# Patient Record
Sex: Female | Born: 1990 | Race: White | Hispanic: No | Marital: Single | Smoking: Never smoker
Health system: Southern US, Community
[De-identification: ages and names within clinical notes are randomized; demographics above are authoritative.]

## PROBLEM LIST (undated history)

## (undated) DIAGNOSIS — Z8742 Personal history of other diseases of the female genital tract: Secondary | ICD-10-CM

## (undated) HISTORY — PX: SHOULDER ARTHROSCOPY W/ LABRAL REPAIR: SHX2399

## (undated) HISTORY — PX: WISDOM TOOTH EXTRACTION: SHX21

## (undated) HISTORY — DX: Personal history of other diseases of the female genital tract: Z87.42

---

## 2016-12-14 ENCOUNTER — Ambulatory Visit (INDEPENDENT_AMBULATORY_CARE_PROVIDER_SITE_OTHER): Payer: Managed Care, Other (non HMO) | Admitting: Physician Assistant

## 2016-12-14 ENCOUNTER — Encounter: Payer: Self-pay | Admitting: Physician Assistant

## 2016-12-14 VITALS — BP 130/79 | HR 80 | Ht 67.0 in | Wt 196.0 lb

## 2016-12-14 DIAGNOSIS — Z13220 Encounter for screening for lipoid disorders: Secondary | ICD-10-CM

## 2016-12-14 DIAGNOSIS — Z0000 Encounter for general adult medical examination without abnormal findings: Secondary | ICD-10-CM | POA: Diagnosis not present

## 2016-12-14 DIAGNOSIS — Z8742 Personal history of other diseases of the female genital tract: Secondary | ICD-10-CM | POA: Insufficient documentation

## 2016-12-14 DIAGNOSIS — Z131 Encounter for screening for diabetes mellitus: Secondary | ICD-10-CM | POA: Diagnosis not present

## 2016-12-14 DIAGNOSIS — Z87898 Personal history of other specified conditions: Secondary | ICD-10-CM

## 2016-12-14 HISTORY — DX: Personal history of other diseases of the female genital tract: Z87.42

## 2016-12-14 LAB — COMPLETE METABOLIC PANEL WITH GFR
ALBUMIN: 4.2 g/dL (ref 3.6–5.1)
ALK PHOS: 55 U/L (ref 33–115)
ALT: 12 U/L (ref 6–29)
AST: 14 U/L (ref 10–30)
BUN: 14 mg/dL (ref 7–25)
CALCIUM: 9.1 mg/dL (ref 8.6–10.2)
CO2: 26 mmol/L (ref 20–31)
CREATININE: 0.79 mg/dL (ref 0.50–1.10)
Chloride: 105 mmol/L (ref 98–110)
GFR, Est African American: >89 mL/min (ref >=60)
GFR, Est Non African American: >89 mL/min (ref >=60)
GLUCOSE: 86 mg/dL (ref 65–99)
Potassium: 4.3 mmol/L (ref 3.5–5.3)
Sodium: 139 mmol/L (ref 135–146)
Total Bilirubin: 0.5 mg/dL (ref 0.2–1.2)
Total Protein: 6.8 g/dL (ref 6.1–8.1)

## 2016-12-14 LAB — LIPID PANEL
CHOL/HDL RATIO: 3.5 ratio (ref <5.0)
CHOLESTEROL: 179 mg/dL (ref <200)
HDL: 51 mg/dL (ref >50)
LDL Cholesterol: 106 mg/dL — ABNORMAL HIGH (ref <100)
Triglycerides: 109 mg/dL (ref <150)
VLDL: 22 mg/dL (ref <30)

## 2016-12-14 NOTE — Patient Instructions (Signed)

## 2016-12-14 NOTE — Progress Notes (Signed)
   Subjective:    Patient ID: Courtney Morgan, female    DOB: March 09, 1991, 25 y.o.   MRN: [id number]  HPI Pt is a 25 yo female who presents to the clinic to establish care.   She has no PMH.   .. Social History   Social History  . Marital status: Single    Spouse name: N/A  . Number of children: N/A  . Years of education: N/A   Occupational History  . Not on file.   Social History Main Topics  . Smoking status: Never Smoker  . Smokeless tobacco: Never Used  . Alcohol use Yes  . Drug use: No  . Sexual activity: Yes   Other Topics Concern  . Not on file   Social History Narrative  . No narrative on file   .Marland Kitchen. Family History  Problem Relation Age of Onset  . Arthritis/Rheumatoid Mother   . Diabetes Father   . Arthritis/Rheumatoid Maternal Uncle   . Diabetes Paternal Aunt   . Diabetes Paternal Uncle    She needs refills on OCP. Last pap was 2015 and abnormal but has not had another pap.    Review of Systems  All other systems reviewed and are negative.      Objective:   Physical Exam  Constitutional: She is oriented to person, place, and time. She appears well-developed and well-nourished.  HENT:  Head: Normocephalic and atraumatic.  Right Ear: External ear normal.  Left Ear: External ear normal.  Nose: Nose normal.  Mouth/Throat: Oropharynx is clear and moist. No oropharyngeal exudate.  Eyes: Conjunctivae and EOM are normal. Pupils are equal, round, and reactive to light. Right eye exhibits no discharge. Left eye exhibits no discharge.  Neck: Normal range of motion. Neck supple. No thyromegaly present.  Cardiovascular: Normal rate, regular rhythm and normal heart sounds.   Pulmonary/Chest: Effort normal and breath sounds normal.  Abdominal: Soft. Bowel sounds are normal. She exhibits no distension and no mass. There is no tenderness. There is no rebound and no guarding.  Lymphadenopathy:    She has no cervical adenopathy.  Neurological: She is alert and  oriented to person, place, and time.  Psychiatric: She has a normal mood and affect. Her behavior is normal.          Assessment & Plan:  Marland Kitchen.Marland Kitchen.Ladona Ridgelaylor was seen today for establish care.  Diagnoses and all orders for this visit:  Routine physical examination  Screening for lipid disorders -     Lipid panel  Screening for diabetes mellitus -     COMPLETE METABOLIC PANEL WITH GFR   Will reschedule for PAP. Will refill OCP at that time.  Discussed weight control with diet and exercise.  Encouraged vitamin D 800 units and calcium 1500mg .

## 2017-01-11 ENCOUNTER — Ambulatory Visit (INDEPENDENT_AMBULATORY_CARE_PROVIDER_SITE_OTHER): Payer: Managed Care, Other (non HMO) | Admitting: Physician Assistant

## 2017-01-11 ENCOUNTER — Other Ambulatory Visit (HOSPITAL_COMMUNITY)
Admission: RE | Admit: 2017-01-11 | Discharge: 2017-01-11 | Disposition: A | Discharge status: Home / Self Care | Payer: Managed Care, Other (non HMO) | Source: Ambulatory Visit | Attending: Physician Assistant | Admitting: Physician Assistant

## 2017-01-11 ENCOUNTER — Encounter: Payer: Self-pay | Admitting: Physician Assistant

## 2017-01-11 VITALS — BP 131/85 | HR 91 | Ht 67.0 in | Wt 204.0 lb

## 2017-01-11 DIAGNOSIS — Z01411 Encounter for gynecological examination (general) (routine) with abnormal findings: Secondary | ICD-10-CM | POA: Diagnosis present

## 2017-01-11 DIAGNOSIS — Z113 Encounter for screening for infections with a predominantly sexual mode of transmission: Secondary | ICD-10-CM | POA: Insufficient documentation

## 2017-01-11 DIAGNOSIS — R87610 Atypical squamous cells of undetermined significance on cytologic smear of cervix (ASC-US): Secondary | ICD-10-CM

## 2017-01-11 DIAGNOSIS — Z01419 Encounter for gynecological examination (general) (routine) without abnormal findings: Secondary | ICD-10-CM

## 2017-01-11 DIAGNOSIS — Z1151 Encounter for screening for human papillomavirus (HPV): Secondary | ICD-10-CM | POA: Diagnosis present

## 2017-01-11 DIAGNOSIS — R87810 Cervical high risk human papillomavirus (HPV) DNA test positive: Secondary | ICD-10-CM

## 2017-01-11 MED ORDER — LEVONORGEST-ETH ESTRAD 91-DAY 0.15-0.03 &0.01 MG PO TABS: 1.0000 | ORAL_TABLET | Freq: Every day | ORAL | 4 refills | Status: DC

## 2017-01-11 NOTE — Progress Notes (Addendum)
   Subjective:    Patient ID: Courtney Morgan, female    DOB: 1991/12/09, 26 y.o.   MRN: [id number]  HPI  Patient is a 26yo female who presents to the clinic for a refill on her birth control and pap smear.  Patient reports she had an abnormal pap 2 years ago, which was her first pap smear. No follow was done. Patient denies receiving STD testing today.  Patient states she is in a monogamous relationship.  Patient states she would like a refill of her birth control.  Patient denies any side effects or concerns with the medication.       Review of Systems  All other systems reviewed and are negative.      Objective:   Physical Exam  Constitutional: She is oriented to person, place, and time. She appears well-developed and well-nourished.  Genitourinary: Vagina normal and uterus normal. Cervix exhibits friability. Cervix exhibits no motion tenderness and no discharge. Right adnexum displays no mass and no tenderness. Left adnexum displays no mass and no tenderness.    Neurological: She is alert and oriented to person, place, and time.  Psychiatric: She has a normal mood and affect. Her behavior is normal.  Nursing note and vitals reviewed.         Assessment & Plan:  Marland Kitchen.Marland Kitchen.Ladona Ridgelaylor was seen today for gynecologic exam.  Diagnoses and all orders for this visit:  Encounter for annual routine gynecological examination -     Cytology - PAP -     Levonorgestrel-Ethinyl Estradiol (AMETHIA,CAMRESE) 0.15-0.03 &0.01 MG tablet; Take 1 tablet by mouth daily.    Patient received a pap smear today.  Patient declined STD testing.   Patient's birth control was refilled.  Patient informed to follow-up in 3 years if pap is normal.

## 2017-01-11 NOTE — Patient Instructions (Signed)
Follow up 3 years if normal pap.

## 2017-01-15 LAB — CYTOLOGY - PAP
DIAGNOSIS: UNDETERMINED — AB
HPV (WINDOPATH): DETECTED — AB

## 2017-01-18 DIAGNOSIS — R87610 Atypical squamous cells of undetermined significance on cytologic smear of cervix (ASC-US): Secondary | ICD-10-CM | POA: Insufficient documentation

## 2017-01-18 DIAGNOSIS — R87810 Cervical high risk human papillomavirus (HPV) DNA test positive: Secondary | ICD-10-CM

## 2017-01-18 NOTE — Addendum Note (Signed)
Addended by: Jomarie LongsBREEBACK, JADE L on: 01/18/2017 11:24 AM   Modules accepted: Orders

## 2017-02-01 ENCOUNTER — Ambulatory Visit (INDEPENDENT_AMBULATORY_CARE_PROVIDER_SITE_OTHER): Payer: Managed Care, Other (non HMO) | Admitting: Obstetrics & Gynecology

## 2017-02-01 ENCOUNTER — Encounter (INDEPENDENT_AMBULATORY_CARE_PROVIDER_SITE_OTHER): Payer: Self-pay

## 2017-02-01 ENCOUNTER — Encounter: Payer: Self-pay | Admitting: Obstetrics & Gynecology

## 2017-02-01 VITALS — BP 137/91 | HR 83 | Resp 16 | Wt 209.0 lb

## 2017-02-01 DIAGNOSIS — R87810 Cervical high risk human papillomavirus (HPV) DNA test positive: Secondary | ICD-10-CM | POA: Diagnosis not present

## 2017-02-01 DIAGNOSIS — R87610 Atypical squamous cells of undetermined significance on cytologic smear of cervix (ASC-US): Secondary | ICD-10-CM | POA: Diagnosis not present

## 2017-02-01 NOTE — Patient Instructions (Signed)
Return to clinic for any scheduled appointments or for any gynecologic concerns as needed.   

## 2017-02-01 NOTE — Progress Notes (Signed)
   GYNECOLOGY OFFICE VISIT NOTE  History:  26 y.o. G0P0000 here today for discussion of management of recent ASCUS +HPV pap smear on 01/11/2017. She had similar pap smear in 2015, never had a normal pap smear. She did receive HPV vaccine series as a child. She denies any abnormal vaginal discharge, bleeding, pelvic pain or other concerns.   Past Medical History:  Diagnosis Date  . Hx of abnormal cervical Pap smear 12/14/2016   2015.    Past Surgical History:  Procedure Laterality Date  . SHOULDER ARTHROSCOPY W/ LABRAL REPAIR Left   . WISDOM TOOTH EXTRACTION      The following portions of the patient's history were reviewed and updated as appropriate: allergies, current medications, past family history, past medical history, past social history, past surgical history and problem list.   Review of Systems:  Pertinent items noted in HPI and remainder of comprehensive ROS otherwise negative.   Objective:  Physical Exam BP (!) 137/91   Pulse 83   Resp 16   Wt 209 lb (94.8 kg)   LMP 12/14/2016   BMI 32.73 kg/m  CONSTITUTIONAL: Well-developed, well-nourished female in no acute distress.  HENT:  Normocephalic, atraumatic. External right and left ear normal. Oropharynx is clear and moist EYES: Conjunctivae and EOM are normal. Pupils are equal, round, and reactive to light. No scleral icterus.  NECK: Normal range of motion, supple, no masses SKIN: Skin is warm and dry. No rash noted. Not diaphoretic. No erythema. No pallor. NEUROLOGIC: Alert and oriented to person, place, and time. Normal reflexes, muscle tone coordination. No cranial nerve deficit noted. PSYCHIATRIC: Normal mood and affect. Normal behavior. Normal judgment and thought content. CARDIOVASCULAR: Normal heart rate noted RESPIRATORY: Effort and breath sounds normal, no problems with respiration noted ABDOMEN: Soft, no distention noted.   PELVIC: Deferred MUSCULOSKELETAL: Normal range of motion. No edema  noted.   Assessment & Plan:  Atypical squamous cell changes of undetermined significance (ASCUS) on cervical cytology with positive high risk human papilloma virus (HPV) Discussed role of HPV in cervical dysplasia, need for surveillance. Also discussed ASCCP guidelines given her age, no need for colposcopy now, will repeat cytology in one year.  If abnormality persists then, will proceed with colposcopy. All questions answered. Patient reassured.  Routine preventative health maintenance measures emphasized. Please refer to After Visit Summary for other counseling recommendations.   Return for Pap smear after 01/11/2018.   Total face-to-face time with patient: 20 minutes. Over 50% of encounter was spent on counseling and coordination of care.   Jaynie CollinsUGONNA  Koni Kannan, MD, FACOG Attending Obstetrician & Gynecologist, [facility], [facility]

## 2017-05-13 ENCOUNTER — Other Ambulatory Visit: Payer: Self-pay | Admitting: Physician Assistant

## 2017-05-13 ENCOUNTER — Ambulatory Visit (INDEPENDENT_AMBULATORY_CARE_PROVIDER_SITE_OTHER): Payer: Managed Care, Other (non HMO) | Admitting: Physician Assistant

## 2017-05-13 VITALS — BP 122/82 | HR 76 | Wt 200.0 lb

## 2017-05-13 DIAGNOSIS — W57XXXA Bitten or stung by nonvenomous insect and other nonvenomous arthropods, initial encounter: Secondary | ICD-10-CM

## 2017-05-13 DIAGNOSIS — R21 Rash and other nonspecific skin eruption: Secondary | ICD-10-CM | POA: Diagnosis not present

## 2017-05-13 MED ORDER — DOXYCYCLINE HYCLATE 100 MG PO TABS: 100.0000 mg | ORAL_TABLET | Freq: Two times a day (BID) | ORAL | 0 refills | Status: AC

## 2017-05-13 NOTE — Progress Notes (Signed)
HPI:                                                                Courtney Morgan is a 26 y.o. female [facility] today for "rash on back"  Patient reports she was hunting in the woods 3 weeks ago and removed a tick a few hours later. Reports she noticed a pruritic circular rash on her back that developed about 1 week later.  Denies fevers, headaches, myalgias. Does endorse some fatigue. Denies rash anywhere else.   Past Medical History:  Diagnosis Date  . Hx of abnormal cervical Pap smear 12/14/2016   2015.   Past Surgical History:  Procedure Laterality Date  . SHOULDER ARTHROSCOPY W/ LABRAL REPAIR Left   . WISDOM TOOTH EXTRACTION     Social History  Substance Use Topics  . Smoking status: Never Smoker  . Smokeless tobacco: Never Used  . Alcohol use Yes   family history includes Arthritis/Rheumatoid in her maternal uncle and mother; Diabetes in her father, paternal aunt, and paternal uncle.  ROS: negative except as noted in the HPI  Medications: Current Outpatient Prescriptions  Medication Sig Dispense Refill  . Levonorgestrel-Ethinyl Estradiol (AMETHIA,CAMRESE) 0.15-0.03 &0.01 MG tablet Take 1 tablet by mouth daily. 3 Package 4   No current facility-administered medications for this visit.    No Known Allergies     Objective:  BP 122/82   Pulse 76   Wt 200 lb (90.7 kg)   BMI 31.32 kg/m  Gen: well-groomed, cooperative, not ill-appearing, no distress Pulm: Normal work of breathing, normal phonation Neuro: alert and oriented x 3, EOM's intact MSK: moving all extremities, normal gait and station, no peripheral edema Skin: annual, erythematous patch with a central papule on right-side of mid-back      No results found for this or any previous visit (from the past 72 hour(s)). No results found.    Assessment and Plan: 26 y.o. female with   1. Tick bite, initial encounter - [place] spotted  fvr abs pnl(IgG+IgM) - B. burgdorfi antibodies - Ehrlichia antibody panel - doxycycline (VIBRA-TABS) 100 MG tablet; Take 1 tablet (100 mg total) by mouth 2 (two) times daily.  Dispense: 20 tablet; Refill: 0  Patient education and anticipatory guidance given Patient agrees with treatment plan Follow-up as needed if symptoms worsen or fail to improve  Levonne Hubertharley E. Cummings PA-C

## 2017-05-13 NOTE — Patient Instructions (Signed)
- Go downstairs for labs - Take Doxycycline twice a day for 10 days - we will call you if antibiotics need to be extended or discontinued   Lyme Disease Lyme disease is an infection that affects many parts of the body, including the skin, joints, and nervous system. It is a bacterial infection that starts from the bite of an infected tick. The infection can spread, and some of the symptoms are similar to the flu. If Lyme disease is not treated, it may cause joint pain, swelling, numbness, problems thinking, fatigue, muscle weakness, and other problems. What are the causes? This condition is caused by bacteria called Borrelia burgdorferi. You can get Lyme disease by being bitten by an infected tick. The tick must be attached to your skin to pass along the infection. Deer often carry infected ticks. What increases the risk? The following factors may make you more likely to develop this condition:  Living in or visiting these areas in the [place]  [place].  The [place].  The [place].  Spending time in wooded or grassy areas.  Being outdoors with exposed skin.  Camping, gardening, hiking, fishing, or hunting outdoors.  Failing to remove a tick from your skin within 3-4 days. What are the signs or symptoms? Symptoms of this condition include:  A round, red rash that surrounds the center of the tick bite. This is the first sign of infection. The center of the rash may be blood colored or have tiny blisters.  Fatigue.  Headache.  Chills and fever.  General achiness.  Joint pain, often in the knees.  Muscle pain.  Swollen lymph glands.  Stiff neck. How is this diagnosed? This condition is diagnosed based on:  Your symptoms and medical history.  A physical exam.  A blood test. How is this treated? The main treatment for this condition is antibiotic medicine, which is usually taken by mouth (orally). The length of treatment depends on how soon after a  tick bite you begin taking the medicine. In some cases, treatment is necessary for several weeks. If the infection is severe, antibiotics may need to be given through an IV tube that is inserted into one of your veins. Follow these instructions at home:  Take your antibiotic medicine as told by your health care provider. Do not stop taking the antibiotic even if you start to feel better.  Ask your health care provider about takinga probiotic in between doses of your antibiotic to help avoid stomach upset or diarrhea.  Check with your health care provider before supplementing your treatment. Many alternative therapies have not been proven and may be harmful to you.  Keep all follow-up visits as told by your health care provider. This is important. How is this prevented? You can become reinfected if you get another tick bite from an infected tick. Take these steps to help prevent an infection:  Cover your skin with light-colored clothing when you are outdoors in the spring and summer months.  Spray clothing and skin with bug spray. The spray should be 20-30% DEET.  Avoid wooded, grassy, and shaded areas.  Remove yard litter, brush, trash, and plants that attract deer and rodents.  Check yourself for ticks when you come indoors.  Wash clothing worn each day.  Check your pets for ticks before they come inside.  If you find a tick:  Remove it with tweezers.  Clean your hands and the bite area with rubbing alcohol or soap and water. Pregnant women  should take special care to avoid tick bites because the infection can be passed along to the fetus. Contact a health care provider if:  You have symptoms after treatment.  You have removed a tick and want to bring it to your health care provider for testing. Get help right away if:  You have an irregular heartbeat.  You have nerve pain.  Your face feels numb. This information is not intended to replace advice given to you by your  health care provider. Make sure you discuss any questions you have with your health care provider. Document Released: 03/22/2001 Document Revised: 08/04/2016 Document Reviewed: 08/04/2016 Elsevier Interactive Patient Education  2017 [employer]

## 2017-05-14 LAB — LYME AB/WESTERN BLOT REFLEX: B burgdorferi Ab IgG+IgM: <0.9 Index (ref <0.90)

## 2017-05-17 LAB — ROCKY MTN SPOTTED FVR ABS PNL(IGG+IGM)
RMSF IGG: NOT DETECTED
RMSF IGM: NOT DETECTED

## 2017-05-17 LAB — EHRLICHIA ANTIBODY PANEL
E chaffeensis (HGE) Ab, IgG: <1:64 {titer}
E chaffeensis (HGE) Ab, IgM: <1:20 {titer}

## 2017-09-28 ENCOUNTER — Encounter: Payer: Self-pay | Admitting: Physician Assistant

## 2017-09-28 ENCOUNTER — Ambulatory Visit (INDEPENDENT_AMBULATORY_CARE_PROVIDER_SITE_OTHER): Payer: Managed Care, Other (non HMO) | Admitting: Physician Assistant

## 2017-09-28 VITALS — BP 140/79 | HR 93 | Temp 98.7°F | Ht 67.0 in | Wt 197.0 lb

## 2017-09-28 DIAGNOSIS — R590 Localized enlarged lymph nodes: Secondary | ICD-10-CM | POA: Diagnosis not present

## 2017-09-28 LAB — POCT URINALYSIS DIPSTICK
BILIRUBIN UA: NEGATIVE
Blood, UA: NEGATIVE
Glucose, UA: NEGATIVE
KETONES UA: NEGATIVE
LEUKOCYTES UA: NEGATIVE
NITRITE UA: NEGATIVE
PH UA: 6.5 (ref 5.0–8.0)
Protein, UA: NEGATIVE
Spec Grav, UA: 1.02 (ref 1.010–1.025)
Urobilinogen, UA: 0.2 E.U./dL

## 2017-09-28 LAB — WET PREP FOR TRICH, YEAST, CLUE

## 2017-09-28 NOTE — Progress Notes (Signed)
No yeast/BV/trich seen on wet prep.

## 2017-09-28 NOTE — Patient Instructions (Signed)
Lymphadenopathy Lymphadenopathy refers to swollen or enlarged lymph glands, also called lymph nodes. Lymph glands are part of your body's defense (immune) system, which protects the body from infections, germs, and diseases. Lymph glands are found in many locations in your body, including the neck, underarm, and groin. Many things can cause lymph glands to become enlarged. When your immune system responds to germs, such as viruses or bacteria, infection-fighting cells and fluid build up. This causes the glands to grow in size. Usually, this is not something to worry about. The swelling and any soreness often go away without treatment. However, swollen lymph glands can also be caused by a number of diseases. Your health care provider may do various tests to help determine the cause. If the cause of your swollen lymph glands cannot be found, it is important to monitor your condition to make sure the swelling goes away. Follow these instructions at home: Watch your condition for any changes. The following actions may help to lessen any discomfort you are feeling:  Get plenty of rest.  Take medicines only as directed by your health care provider. Your health care provider may recommend over-the-counter medicines for pain.  Check your lymph nodes daily for any changes.  Keep all follow-up visits as directed by your health care provider. This is important.  Contact a health care provider if:  Your lymph nodes are still swollen after 2 weeks.  Your swelling increases or spreads to other areas.  Your lymph nodes are hard, seem fixed to the skin, or are growing rapidly.  Your skin over the lymph nodes is red and inflamed.  You have a fever.  You have chills.  You have fatigue.  You develop a sore throat.  You have abdominal pain.  You have weight loss.  You have night sweats. Get help right away if:  You notice fluid leaking from the area of the enlarged lymph node.  You have severe  pain in any area of your body.  You have chest pain.  You have shortness of breath. This information is not intended to replace advice given to you by your health care provider. Make sure you discuss any questions you have with your health care provider. Document Released: 09/22/2008 Document Revised: 05/21/2016 Document Reviewed: 07/19/2014 Elsevier Interactive Patient Education  [employer].

## 2017-09-28 NOTE — Addendum Note (Signed)
Addended by: Jomarie Longs on: 09/28/2017 05:19 PM   Modules accepted: Level of Service

## 2017-09-28 NOTE — Progress Notes (Addendum)
Subjective:    Patient ID: Courtney Morgan, female    DOB: 04-25-1991, 26 y.o.   MRN: [id number]  HPI The patient is a [age] year old female who presents today for a swollen inguinal lymph node on the right side. She states she noticed the lymph node last Wednesday. She is concerned given her history of an abnormal pap. She had a pap in January of this year that was positive for HPV with ASCUS. She was sent to GYN for a colposcopy, however GYN advised she can return in one year for repeat pap. She states that the lymph node is uncomfortable when clothing contacts it, but not painful to touch. She denies warmth or redness in the area. She denies any fever, chills.  She denies recent trauma, increase in sex, or change in medications. She used a heating pad on the area last night and reported some relief. LMP was three weeks ago. She denies family history of cancers.   .. Active Ambulatory Problems    Diagnosis Date Noted  . Hx of abnormal cervical Pap smear 12/14/2016  . Atypical squamous cell changes of undetermined significance (ASCUS) on cervical cytology with positive high risk human papilloma virus (HPV) 01/18/2017   Resolved Ambulatory Problems    Diagnosis Date Noted  . No Resolved Ambulatory Problems   Past Medical History:  Diagnosis Date  . Hx of abnormal cervical Pap smear 12/14/2016     Review of Systems  All other systems reviewed and are negative.      Objective:   Physical Exam  Constitutional: She is oriented to person, place, and time. She appears well-developed and well-nourished.  HENT:  Head: Normocephalic and atraumatic.  Neck:  Negative for axillary lymphnodes.   Pulmonary/Chest:  No CVA tenderness.   Abdominal:    Lymphadenopathy:    She has no cervical adenopathy.  Neurological: She is alert and oriented to person, place, and time.  Skin: Skin is warm and dry.  Psychiatric: She has a normal mood and affect. Her behavior is normal.        Assessment & Plan:  Marland KitchenMarland KitchenDiagnoses and all orders for this visit:  Inguinal lymphadenopathy -     CBC with Differential/Platelet -     HIV antibody (with reflex) -     RPR -     POCT urinalysis dipstick -     WET PREP FOR TRICH, YEAST, CLUE -     C. trachomatis/N. gonorrhoeae RNA   Results for orders placed or performed in visit on 09/28/17  POCT urinalysis dipstick  Result Value Ref Range   Color, UA dark yellow    Clarity, UA clear    Glucose, UA neg    Bilirubin, UA neg    Ketones, UA neg    Spec Grav, UA 1.020 1.010 - 1.025   Blood, UA neg    pH, UA 6.5 5.0 - 8.0   Protein, UA neg    Urobilinogen, UA 0.2 0.2 or 1.0 E.U./dL   Nitrite, UA neg    Leukocytes, UA Negative Negative   We will order a CBC to look for elevated WBC, UA to rule out UTI as the cause of inguinal lymphadenopathy. No signs of UTI on dipstick. We also performed STD testing to rule out Gonorrhea/Chlamydia as the cause of the lymphadenopathy. We explained to the patient that lymphadenopathy can occur as a result of trauma, infection, or viral illness and that they may resolve on their own without intervention. She understood  and agreed with this information. Given her history of abnormal pap she is considered at risk for malignancy, so we will follow up in two to three weeks to ensure the lymphadenopathy has resolved. If it has not, we will consult GYN.    Marland KitchenSpent 30 minutes with patient and greater than 50 percent of visit spent counseling patient regarding treatment plan.

## 2017-09-29 LAB — CBC WITH DIFFERENTIAL/PLATELET
BASOS PCT: 0.8 %
Basophils Absolute: 58 cells/uL (ref 0–200)
EOS ABS: 88 {cells}/uL (ref 15–500)
Eosinophils Relative: 1.2 %
HEMATOCRIT: 43 % (ref 35.0–45.0)
Hemoglobin: 14.8 g/dL (ref 11.7–15.5)
LYMPHS ABS: 2489 {cells}/uL (ref 850–3900)
MCH: 31.4 pg (ref 27.0–33.0)
MCHC: 34.4 g/dL (ref 32.0–36.0)
MCV: 91.3 fL (ref 80.0–100.0)
MPV: 9.9 fL (ref 7.5–12.5)
Monocytes Relative: 4 %
NEUTROS ABS: 4373 {cells}/uL (ref 1500–7800)
Neutrophils Relative %: 59.9 %
PLATELETS: 273 10*3/uL (ref 140–400)
RBC: 4.71 10*6/uL (ref 3.80–5.10)
RDW: 12.5 % (ref 11.0–15.0)
Total Lymphocyte: 34.1 %
WBC: 7.3 10*3/uL (ref 3.8–10.8)
WBCMIX: 292 {cells}/uL (ref 200–950)

## 2017-09-29 LAB — RPR: RPR Ser Ql: NONREACTIVE

## 2017-09-29 LAB — C. TRACHOMATIS/N. GONORRHOEAE RNA
C. TRACHOMATIS RNA, TMA: NOT DETECTED
N. gonorrhoeae RNA, TMA: NOT DETECTED

## 2017-09-29 LAB — HIV ANTIBODY (ROUTINE TESTING W REFLEX): HIV: NONREACTIVE

## 2017-10-04 ENCOUNTER — Encounter: Payer: Self-pay | Admitting: Physician Assistant

## 2018-01-16 ENCOUNTER — Other Ambulatory Visit: Payer: Self-pay | Admitting: Physician Assistant

## 2018-01-16 DIAGNOSIS — Z01419 Encounter for gynecological examination (general) (routine) without abnormal findings: Secondary | ICD-10-CM

## 2018-04-18 ENCOUNTER — Ambulatory Visit (INDEPENDENT_AMBULATORY_CARE_PROVIDER_SITE_OTHER): Payer: Managed Care, Other (non HMO) | Admitting: Physician Assistant

## 2018-04-18 ENCOUNTER — Encounter: Payer: Self-pay | Admitting: Physician Assistant

## 2018-04-18 VITALS — BP 129/87 | HR 72 | Temp 98.9°F | Wt 213.0 lb

## 2018-04-18 DIAGNOSIS — Z713 Dietary counseling and surveillance: Secondary | ICD-10-CM | POA: Diagnosis not present

## 2018-04-18 DIAGNOSIS — L743 Miliaria, unspecified: Secondary | ICD-10-CM

## 2018-04-18 DIAGNOSIS — E6609 Other obesity due to excess calories: Secondary | ICD-10-CM | POA: Diagnosis not present

## 2018-04-18 MED ORDER — TOPIRAMATE 25 MG PO TABS: ORAL_TABLET | ORAL | 0 refills | Status: DC

## 2018-04-18 MED ORDER — TRIAMCINOLONE ACETONIDE 0.5 % EX CREA: 1.0000 "application " | TOPICAL_CREAM | Freq: Two times a day (BID) | CUTANEOUS | 3 refills | Status: DC

## 2018-04-18 MED ORDER — PHENTERMINE HCL 15 MG PO CAPS: 15.0000 mg | ORAL_CAPSULE | ORAL | 0 refills | Status: DC

## 2018-04-18 NOTE — Progress Notes (Signed)
HPI:                                                                Courtney Morgan is a 27 y.o. female who [facility] today for rash and weight loss counseling  Complains of a recurrent rash for the last year or more. It erupts on her anterior thighs after sweating or showering. Has the appearance of small pimples, bleed when scratched. Denies presence of rash today.   Obesity: current weight 213 lb. Has been at current weight for 2 months. Reports she is concerned about her health and developing diabetes and would like medical weight loss management. Interested in losing 30-40 pounds Reports she was able to get down to 185-190 Lb last June with keto-diet, but has steadily gained the weight back and some additional weight Reports period of greatest weight gain was freshman year of college, age 27  Occasionally drinks sweet tea, does not drink soda  Has been exercising 5 days per week (kickboxing 3 days per week and beach body work-out Started clean eating Jan 1, cut back on carbs and sugar, and increasing vegetables     Depression screen PHQ 2/9 09/28/2017  Decreased Interest 0  Down, Depressed, Hopeless 0  PHQ - 2 Score 0    No flowsheet data found.    Past Medical History:  Diagnosis Date  . Hx of abnormal cervical Pap smear 12/14/2016   2015.   Past Surgical History:  Procedure Laterality Date  . SHOULDER ARTHROSCOPY W/ LABRAL REPAIR Left   . WISDOM TOOTH EXTRACTION     Social History   Tobacco Use  . Smoking status: Never Smoker  . Smokeless tobacco: Never Used  Substance Use Topics  . Alcohol use: Yes   family history includes Arthritis/Rheumatoid in her maternal uncle and mother; Diabetes in her father, paternal aunt, and paternal uncle.    ROS: negative except as noted in the HPI  Medications: Current Outpatient Medications  Medication Sig Dispense Refill  . Levonorgestrel-Ethinyl Estradiol  (AMETHIA,CAMRESE) 0.15-0.03 &0.01 MG tablet TAKE 1 TABLET BY MOUTH DAILY. 91 tablet 3   No current facility-administered medications for this visit.    No Known Allergies     Objective:  BP 129/87   Pulse 72   Temp 98.9 F (37.2 C) (Oral)   Wt 213 lb (96.6 kg)   BMI 33.36 kg/m  Gen:  alert, not ill-appearing, no distress, appropriate for age, obese female HEENT: head normocephalic without obvious abnormality, conjunctiva and cornea clear, trachea midline Pulm: Normal work of breathing, normal phonation, clear to auscultation bilaterally, no wheezes, rales or rhonchi CV: Normal rate, regular rhythm, s1 and s2 distinct, no murmurs, clicks or rubs  Neuro: alert and oriented x 3, no tremor MSK: extremities atraumatic, normal gait and station Skin: warm, dry, intact mild razor burn on bilateral lower extremities Psych: well-groomed, cooperative, good eye contact, euthymic mood, affect mood-congruent, speech is articulate, and thought processes clear and goal-directed    No results found for this or any previous visit (from the past 72 hour(s)). No results found.    Assessment and Plan: 27 y.o. female with   1. Class 1 obesity due to excess calories without serious comorbidity in adult,  unspecified BMI Wt Readings from Last 3 Encounters:  04/18/18 213 lb (96.6 kg)  09/28/17 197 lb (89.4 kg)  05/13/17 200 lb (90.7 kg)  - counseled on weight loss through decreased caloric intake and increased aerobic exercise - 1800-2000 calorie moderate protein diet (see patient instructions) - discussed medication options. Patient's aunt has done well on Phentermine/Topiramate. Starting Phentermine 15 mg QAM and Topiramate 25 mg nightly with self-titration to 50mg  bid as tolerated. 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 - triamcinolone prn for symptomatic areas  Encounter for weight loss counseling  Class 1 obesity due to excess calories without  serious comorbidity in adult, unspecified BMI - Plan: topiramate (TOPAMAX) 25 MG tablet, phentermine 15 MG capsule  Miliaria - Plan: triamcinolone cream (KENALOG) 0.5 %    Patient education and anticipatory guidance given Patient agrees with treatment plan Follow-up in 1 month for medical weight loss management or sooner as needed if symptoms worsen or fail to improve  I spent 40 minutes with this patient, greater than 50% was face-to-face time counseling regarding the above diagnoses  Levonne Hubert PA-C

## 2018-04-18 NOTE — Patient Instructions (Addendum)
For miliaria: Itchy areas - apply triamcinolone cream twice a day. Do not use for more than 2 weeks as this can cause skin atrophy  General measures-Patients with all types of miliaria may benefit from the following measures to minimize sweating and obstruction of eccrine sweat ducts: ?Move patient to a cooler environment, if possible ?Wear breathable clothing (such as cotton) that does not occlude the skin ?Remove occlusive bandages in the affected area and use more porous alternatives, if needed ?Treat fever with antipyretics In addition, daily gentle exfoliation with a rough cloth during bathing or showering may help to improve miliaria by removing debris (eg, corneocytes, sebum, etc) that may occlude the eccrine duct. The use of soap in patients with miliaria rubra is controversial because of the potential for inducing additional skin irritation. In our experience, soap is helpful if not combined with aggressive scrubbing.  For weight loss: - 1800-2000 calorie moderate protein diet - 30-35% calories from protein - 30% calories from fat - 35-40% from carbs: If diabetic, limit carbs to 30 g per meal and 15 g per snack - MEASURE your calories (MyFitnessPal, LoseIt app of some sort) - 8-11 glasses of water per day - limit caffeine to 1 unsweetened beverage per day - avoid fried foods, saturated fat, and heavily processed foods - keep sugar as low as possible. Avoid soda, sweet tea and sweetened beverages - follow DASH or Mediterranean diets for recipes - avoid skipping meals. Eat 3 regular meals per day with 1-2 snacks (stay within your calorie goal) OR graze every 4 hours. The important thing is to stay on a schedule - avoid eating within 2 hours of bedtime

## 2018-04-21 DIAGNOSIS — Z713 Dietary counseling and surveillance: Secondary | ICD-10-CM | POA: Insufficient documentation

## 2018-04-21 DIAGNOSIS — L743 Miliaria, unspecified: Secondary | ICD-10-CM | POA: Insufficient documentation

## 2018-04-21 DIAGNOSIS — E6609 Other obesity due to excess calories: Secondary | ICD-10-CM | POA: Insufficient documentation

## 2018-05-10 ENCOUNTER — Other Ambulatory Visit: Payer: Self-pay | Admitting: Physician Assistant

## 2018-05-10 DIAGNOSIS — E6609 Other obesity due to excess calories: Secondary | ICD-10-CM

## 2018-05-16 ENCOUNTER — Encounter: Payer: Self-pay | Admitting: Physician Assistant

## 2018-05-16 ENCOUNTER — Ambulatory Visit (INDEPENDENT_AMBULATORY_CARE_PROVIDER_SITE_OTHER): Payer: Managed Care, Other (non HMO) | Admitting: Physician Assistant

## 2018-05-16 DIAGNOSIS — E6609 Other obesity due to excess calories: Secondary | ICD-10-CM | POA: Diagnosis not present

## 2018-05-16 MED ORDER — PHENTERMINE HCL 15 MG PO CAPS: 15.0000 mg | ORAL_CAPSULE | ORAL | 0 refills | Status: DC

## 2018-05-16 MED ORDER — TOPIRAMATE 25 MG PO TABS: ORAL_TABLET | ORAL | 5 refills | Status: DC

## 2018-05-16 NOTE — Progress Notes (Signed)
   Subjective:    Patient ID: Courtney Morgan, female    DOB: 10-Jul-1991, 27 y.o.   MRN: [id number]  HPI  Patient is a 27 year old female who presents to the clinic to follow-up on weight loss.  She has lost 13 pounds in the last month.  She is taking phentermine 15 mg and Topamax 25 mg twice a day.  She has cut back on the kickboxing but she is walking and jogging at least 3-4 times a week.  She has noticed that beer, sodas, carbonated waters all taste bad to her.  She is trying to keep her calories around 1800 a day.  Her goal weight is 160.  She denies any increase in anxiety, insomnia, palpitations.  .. Active Ambulatory Problems    Diagnosis Date Noted  . Hx of abnormal cervical Pap smear 12/14/2016  . Atypical squamous cell changes of undetermined significance (ASCUS) on cervical cytology with positive high risk human papilloma virus (HPV) 01/18/2017  . Inguinal lymphadenopathy 09/28/2017  . 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 Encounter for weight loss counseling 04/21/2018  . Class 1 obesity due to excess calories without serious comorbidity in adult 04/21/2018   Resolved Ambulatory Problems    Diagnosis Date Noted  . No Resolved Ambulatory Problems   Past Medical History:  Diagnosis Date  . Hx of abnormal cervical Pap smear 12/14/2016      Review of Systems    see HPI.  Objective:   Physical Exam  Constitutional: She is oriented to person, place, and time. She appears well-developed and well-nourished.  HENT:  Head: Normocephalic and atraumatic.  Cardiovascular: Normal rate, regular rhythm and normal heart sounds.  Pulmonary/Chest: Effort normal and breath sounds normal.  Neurological: She is alert and oriented to person, place, and time.  Psychiatric: She has a normal mood and affect. Her behavior is normal.          Assessment & Plan:  Marland KitchenMarland KitchenDiagnoses and all orders for this visit:  Class 1 obesity due to excess calories without serious comorbidity in adult, unspecified BMI -      phentermine 15 MG capsule; Take 1 capsule (15 mg total) by mouth every morning. -     topiramate (TOPAMAX) 25 MG tablet; Take one tablet twice a day.   Patient is doing extremely well on this current weight loss regimen.  No changes to meds done today.  She also reports that she has had not had any migraines since starting Topamax.  Continue to exercise and increase time, frequency, intensity to progress with weight loss.  She can follow-up in 1 month with nurse visit for phentermine refill. Goal weight 160.

## 2018-06-08 ENCOUNTER — Encounter: Payer: Self-pay | Admitting: Family Medicine

## 2018-06-08 ENCOUNTER — Ambulatory Visit (INDEPENDENT_AMBULATORY_CARE_PROVIDER_SITE_OTHER): Payer: Managed Care, Other (non HMO) | Admitting: Family Medicine

## 2018-06-08 ENCOUNTER — Ambulatory Visit (INDEPENDENT_AMBULATORY_CARE_PROVIDER_SITE_OTHER): Payer: Managed Care, Other (non HMO)

## 2018-06-08 VITALS — BP 134/75 | HR 90 | Ht 66.0 in | Wt 199.0 lb

## 2018-06-08 DIAGNOSIS — M79672 Pain in left foot: Secondary | ICD-10-CM | POA: Diagnosis not present

## 2018-06-08 DIAGNOSIS — S9032XA Contusion of left foot, initial encounter: Secondary | ICD-10-CM

## 2018-06-08 MED ORDER — MELOXICAM 15 MG PO TABS: 15.0000 mg | ORAL_TABLET | Freq: Every day | ORAL | 0 refills | Status: DC | PRN

## 2018-06-08 NOTE — Progress Notes (Signed)
Courtney Morgan is a 27 y.o. female who presents to [facility] today for left foot pain.  Courtney Morgan was in her normal state of health a few days ago when she dropped an empty glass bottle onto her barefoot.  This collided with the dorsal aspect of her midfoot.  She noted pain then which is slowly worsened over the last week or so.  She has pain with ambulation and better with rest.  She is tried rest ice elevation which helped a little.  Additionally she is tried ibuprofen and Aleve which have helped a little.  She is concerned because in about a week she is leaving for [place] for a 10-day trip and is worried about having worsening pain while on vacation.    ROS:  As above  Exam:  BP 134/75   Pulse 90   Ht 5\' 6"  (1.676 m)   Wt 199 lb (90.3 kg)   BMI 32.12 kg/m  General: Well Developed, well nourished, and in no acute distress.  Neuro/Psych: Alert and oriented x3, extra-ocular muscles intact, able to move all 4 extremities, sensation grossly intact. Skin: Warm and dry, no rashes noted.  Respiratory: Not using accessory muscles, speaking in full sentences, trachea midline.  Cardiovascular: Pulses palpable, no extremity edema. Abdomen: Does not appear distended. MSK:  Left foot small amount of ecchymosis at the dorsal midfoot otherwise normal-appearing with no deformity or severe swelling. Foot motion pulses capillary refill and sensation are intact. Tender to palpation dorsal midfoot near the middle cuneiform. Antalgic gait present.    Lab and Radiology Results X-ray images left foot personally interpreted No fractures. Awaiting formal radiology review  Assessment and Plan: 27 y.o. female with left foot contusion.  Plan for postop shoe and meloxicam.  If worsening recheck in the near future.  Recheck after her trip to [place] if still having significant pain.  Wean to regular shoe when able.  Recheck in the near future if  needed.    Orders Placed This Encounter  Procedures  . DG Foot Complete Left    Standing Status:   Future    Number of Occurrences:   1    Standing Expiration Date:   08/09/2019    Order Specific Question:   Reason for Exam (SYMPTOM  OR DIAGNOSIS REQUIRED)    Answer:   eval pain left foot    Order Specific Question:   Is patient pregnant?    Answer:   No    Order Specific Question:   Preferred imaging location?    Answer:   [facility]    Order Specific Question:   Radiology Contrast Protocol - do NOT remove file path    Answer:   [email or web address]   Meds ordered this encounter  Medications  . meloxicam (MOBIC) 15 MG tablet    Sig: Take 1 tablet (15 mg total) by mouth daily as needed for pain.    Dispense:  30 tablet    Refill:  0    Historical information moved to improve visibility of documentation.  Past Medical History:  Diagnosis Date  . Hx of abnormal cervical Pap smear 12/14/2016   2015.   Past Surgical History:  Procedure Laterality Date  . SHOULDER ARTHROSCOPY W/ LABRAL REPAIR Left   . WISDOM TOOTH EXTRACTION     Social History   Tobacco Use  . Smoking status: Never Smoker  . Smokeless tobacco: Never Used  Substance Use Topics  . Alcohol use: Yes  family history includes Arthritis/Rheumatoid in her maternal uncle and mother; Diabetes in her father, paternal aunt, and paternal uncle.  Medications: Current Outpatient Medications  Medication Sig Dispense Refill  . Levonorgestrel-Ethinyl Estradiol (AMETHIA,CAMRESE) 0.15-0.03 &0.01 MG tablet TAKE 1 TABLET BY MOUTH DAILY. 91 tablet 3  . phentermine 15 MG capsule Take 1 capsule (15 mg total) by mouth every morning. 30 capsule 0  . topiramate (TOPAMAX) 25 MG tablet Take one tablet twice a day. 60 tablet 5  . triamcinolone cream (KENALOG) 0.5 % Apply 1 application topically 2 (two) times daily. To affected areas. 30 g 3  . meloxicam (MOBIC) 15 MG tablet Take 1  tablet (15 mg total) by mouth daily as needed for pain. 30 tablet 0   No current facility-administered medications for this visit.    No Known Allergies    Discussed warning signs or symptoms. Please see discharge instructions. Patient expresses understanding.

## 2018-06-08 NOTE — Patient Instructions (Addendum)
Thank you for coming in today. Use the post op shoe as needed.  If not better when you get back let me know.  Use meloxicam daily as needed for pain.  If you are taking this medicine every day for a few weeks take it with prolosec or zanatac or pepcid to prevent stomach ulcer.   If worsening before the trip advance to cam walker boot.    It is ok to take meloxicam with tylenol but not ibuprofen or aleve.  Tyleneol in the [place] is called paracetamol.   Contusion A contusion is a deep bruise. Contusions happen when an injury causes bleeding under the skin. Symptoms of bruising include pain, swelling, and discolored skin. The skin may turn blue, purple, or yellow. Follow these instructions at home:  Rest the injured area.  If told, put ice on the injured area. ? Put ice in a plastic bag. ? Place a towel between your skin and the bag. ? Leave the ice on for 20 minutes, 2-3 times per day.  If told, put light pressure (compression) on the injured area using an elastic bandage. Make sure the bandage is not too tight. Remove it and put it back on as told by your doctor.  If possible, raise (elevate) the injured area above the level of your heart while you are sitting or lying down.  Take over-the-counter and prescription medicines only as told by your doctor. Contact a doctor if:  Your symptoms do not get better after several days of treatment.  Your symptoms get worse.  You have trouble moving the injured area. Get help right away if:  You have very bad pain.  You have a loss of feeling (numbness) in a hand or foot.  Your hand or foot turns pale or cold. This information is not intended to replace advice given to you by your health care provider. Make sure you discuss any questions you have with your health care provider. Document Released: 06/01/2008 Document Revised: 05/21/2016 Document Reviewed: 05/01/2015 Elsevier Interactive Patient Education  2018 [employer]

## 2018-06-14 ENCOUNTER — Ambulatory Visit (INDEPENDENT_AMBULATORY_CARE_PROVIDER_SITE_OTHER): Payer: Managed Care, Other (non HMO) | Admitting: Physician Assistant

## 2018-06-14 DIAGNOSIS — E6609 Other obesity due to excess calories: Secondary | ICD-10-CM | POA: Diagnosis not present

## 2018-06-14 MED ORDER — PHENTERMINE HCL 15 MG PO CAPS: 15.0000 mg | ORAL_CAPSULE | ORAL | 0 refills | Status: DC

## 2018-06-14 NOTE — Progress Notes (Signed)
   Subjective:    Patient ID: Courtney Morgan, female    DOB: 26-Jun-1991, 26 y.o.   MRN: [id number]  HPI Patient came in today for weight check and BP check. Patient states has been exercising and doing well, has lost 10lbs.  Patient has no complaints of H/A, dizziness, chest palpitations, tachycardia or any mood changes.KG LPN   Review of Systems     Objective:   Physical Exam        Assessment & Plan:  Patient advised to follow up with nurse visit in 1 month for weight and BP check. Patient agrees with plan of care. KG LPN

## 2018-07-06 ENCOUNTER — Other Ambulatory Visit: Payer: Self-pay | Admitting: Family Medicine

## 2018-07-14 ENCOUNTER — Ambulatory Visit (INDEPENDENT_AMBULATORY_CARE_PROVIDER_SITE_OTHER): Payer: Managed Care, Other (non HMO) | Admitting: Family Medicine

## 2018-07-14 VITALS — BP 116/76 | HR 111 | Wt 189.0 lb

## 2018-07-14 DIAGNOSIS — R635 Abnormal weight gain: Secondary | ICD-10-CM

## 2018-07-14 DIAGNOSIS — E6609 Other obesity due to excess calories: Secondary | ICD-10-CM

## 2018-07-14 DIAGNOSIS — R000 Tachycardia, unspecified: Secondary | ICD-10-CM

## 2018-07-14 DIAGNOSIS — Z713 Dietary counseling and surveillance: Secondary | ICD-10-CM | POA: Diagnosis not present

## 2018-07-14 NOTE — Progress Notes (Addendum)
   Subjective:    Patient ID: Courtney Morgan, female    DOB: 24-Apr-1991, 27 y.o.   MRN: [id number]  HPI  Courtney Morgan is here for blood pressure and weight check. Diet and exercise is going well. Denies trouble sleeping or palpitations. She has lost only 1 pound this month. Her heart rate is also elevated. She states she is a little anxious because of her job. I will route to covering provider for recommendations of plan.   Review of Systems     Objective:   Physical Exam        Assessment & Plan:

## 2018-07-14 NOTE — Progress Notes (Signed)
Patient advised that phentermine will not be filled at this time. [facility].

## 2018-07-14 NOTE — Progress Notes (Signed)
Vitals:   07/14/18 0903  BP: 116/76  Pulse: (!) 111  SpO2: 100%   Wt Readings from Last 5 Encounters:  07/14/18 189 lb (85.7 kg)  06/14/18 190 lb (86.2 kg)  06/08/18 199 lb (90.3 kg)  05/16/18 200 lb (90.7 kg)  04/18/18 213 lb (96.6 kg)   Do not continue phentermine.  Patient should recheck with provider to continue phentermine if needed.  Tachycardia and plateauing weight loss or indications for discontinuing phentermine if needed.

## 2018-07-25 ENCOUNTER — Ambulatory Visit: Payer: Managed Care, Other (non HMO) | Admitting: Physician Assistant

## 2018-07-28 ENCOUNTER — Encounter: Payer: Self-pay | Admitting: Physician Assistant

## 2018-07-28 ENCOUNTER — Other Ambulatory Visit: Payer: Self-pay | Admitting: Physician Assistant

## 2018-07-28 ENCOUNTER — Ambulatory Visit: Payer: Managed Care, Other (non HMO) | Admitting: Physician Assistant

## 2018-07-28 VITALS — BP 128/84 | HR 81 | Ht 66.5 in | Wt 192.0 lb

## 2018-07-28 DIAGNOSIS — E6609 Other obesity due to excess calories: Secondary | ICD-10-CM | POA: Diagnosis not present

## 2018-07-28 DIAGNOSIS — Z6830 Body mass index (BMI) 30.0-30.9, adult: Secondary | ICD-10-CM

## 2018-07-28 MED ORDER — PHENTERMINE HCL 8 MG PO TABS: 1.0000 | ORAL_TABLET | Freq: Every day | ORAL | 0 refills | Status: DC

## 2018-07-28 MED ORDER — TOPIRAMATE 50 MG PO TABS: ORAL_TABLET | ORAL | 5 refills | Status: DC

## 2018-07-28 NOTE — Progress Notes (Signed)
   Subjective:    Patient ID: Courtney Morgan, female    DOB: 12-03-1991, 27 y.o.   MRN: [id number]  HPI Pt is a 27 yo female who presents to the clinic to follow up on weight.  She is currently not on phentermine since being taken off of this on 7/18 for increased heart rate and she only lost 1 pound.  Before that way in she had injured her foot and was not able to be active and she also had a 2-week trip to [place] where she ate a lot of fish and chips.  She feels like her cravings has significantly come back off phentermine.  She has gained 3 pounds.  She is now out of her boot and she is able to be more active.  She remains on low-dose of Topamax for her migraines which helps a lot.  She was interested in qsymia for weight loss.  Currently now she is running, kickboxing, beach body workouts.  She is not counting her calories.  He denies any problems with increased anxiety, palpitations or insomnia. No CP.   .. Active Ambulatory Problems    Diagnosis Date Noted  . Hx of abnormal cervical Pap smear 12/14/2016  . Atypical squamous cell changes of undetermined significance (ASCUS) on cervical cytology with positive high risk human papilloma virus (HPV) 01/18/2017  . Inguinal lymphadenopathy 09/28/2017  . 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 Encounter for weight loss counseling 04/21/2018  . Class 1 obesity due to excess calories without serious comorbidity in adult 04/21/2018   Resolved Ambulatory Problems    Diagnosis Date Noted  . No Resolved Ambulatory Problems   Past Medical History:  Diagnosis Date  . Hx of abnormal cervical Pap smear 12/14/2016      Review of Systems  All other systems reviewed and are negative.      Objective:   Physical Exam  Constitutional: She is oriented to person, place, and time. She appears well-developed and well-nourished.  HENT:  Head: Normocephalic and atraumatic.  Cardiovascular: Normal rate and regular rhythm.  Pulmonary/Chest: Effort normal and breath  sounds normal.  Neurological: She is alert and oriented to person, place, and time.  Psychiatric: She has a normal mood and affect. Her behavior is normal.          Assessment & Plan:  Marland Kitchen.Marland Kitchen.Diagnoses and all orders for this visit:  Class 1 obesity due to excess calories without serious comorbidity with body mass index (BMI) of 30.0 to 30.9 in adult -     Phentermine HCl 8 MG TABS; Take 1 tablet by mouth daily. -     topiramate (TOPAMAX) 50 MG tablet; Take one tablet twice a day.   Increase Topamax to 50 mg twice a day.  Decrease phentermine to 8 mg daily.  Discussed this is a long-term maintenance dose.  Hopefully this will help with her cravings.  Discussed other ways to increase her exercise and decrease her caloric intake.  Continue to work on weight loss.  With the lower dose phentermine hopefully her heart rate will not increase.  She will recheck in 1 month with a nurse visit for her heart rate.  If this is good we will start refilling for 3 months.

## 2018-08-02 ENCOUNTER — Other Ambulatory Visit: Payer: Self-pay | Admitting: Physician Assistant

## 2018-08-02 DIAGNOSIS — E6609 Other obesity due to excess calories: Secondary | ICD-10-CM

## 2018-08-02 DIAGNOSIS — Z6830 Body mass index (BMI) 30.0-30.9, adult: Principal | ICD-10-CM

## 2018-08-02 MED ORDER — PHENTERMINE HCL 8 MG PO TABS: 1.0000 | ORAL_TABLET | Freq: Every day | ORAL | 0 refills | Status: DC

## 2018-08-02 NOTE — Telephone Encounter (Signed)
Can you please resend the Phentermine electronically?

## 2018-08-02 NOTE — Telephone Encounter (Signed)
Pt called this morning stating she contacted the pharmacy about her refills. They told her they did not receive an order for the phentermine. The only one they received was for the topiramate. Pharmacy on file is correct.

## 2018-08-03 ENCOUNTER — Other Ambulatory Visit: Payer: Self-pay | Admitting: Family Medicine

## 2018-08-03 DIAGNOSIS — E6609 Other obesity due to excess calories: Secondary | ICD-10-CM

## 2018-08-03 DIAGNOSIS — Z6830 Body mass index (BMI) 30.0-30.9, adult: Principal | ICD-10-CM

## 2018-08-04 MED ORDER — PHENTERMINE HCL 8 MG PO TABS: 1.0000 | ORAL_TABLET | Freq: Every day | ORAL | 0 refills | Status: DC

## 2018-08-04 NOTE — Telephone Encounter (Signed)
Prescription sent as generic.

## 2018-08-04 NOTE — Telephone Encounter (Signed)
Routed to Dr Linford ArnoldMetheney looks like she addressed this a few days ago

## 2018-08-26 ENCOUNTER — Ambulatory Visit (INDEPENDENT_AMBULATORY_CARE_PROVIDER_SITE_OTHER): Payer: Managed Care, Other (non HMO) | Admitting: Physician Assistant

## 2018-08-26 VITALS — BP 112/67 | HR 82 | Wt 184.0 lb

## 2018-08-26 DIAGNOSIS — E6609 Other obesity due to excess calories: Secondary | ICD-10-CM

## 2018-08-26 DIAGNOSIS — Z3041 Encounter for surveillance of contraceptive pills: Secondary | ICD-10-CM | POA: Insufficient documentation

## 2018-08-26 DIAGNOSIS — Z6830 Body mass index (BMI) 30.0-30.9, adult: Secondary | ICD-10-CM | POA: Diagnosis not present

## 2018-08-26 MED ORDER — PHENTERMINE HCL 8 MG PO TABS: 1.0000 | ORAL_TABLET | Freq: Every day | ORAL | 0 refills | Status: DC

## 2018-08-26 NOTE — Progress Notes (Signed)
Patient is here for blood pressure and weight check. Denies any trouble sleeping, palpitations, or any other medication problems. Patient has lost weight. A refill for Phentermine will be sent to Provider for review. Patient advised to keep her upcoming appointment with her PCP. Verbalized understanding, no further questions.  Pt declined flu shot today.  Wt Readings from Last 3 Encounters:  08/26/18 184 lb (83.5 kg)  07/28/18 192 lb (87.1 kg)  07/14/18 189 lb (85.7 kg)    8mg  is long term dose. Refilled phentermine for 3 months. Continue topamax. Tandy GawJade Breeback PA-C

## 2018-09-26 ENCOUNTER — Ambulatory Visit (INDEPENDENT_AMBULATORY_CARE_PROVIDER_SITE_OTHER): Payer: Managed Care, Other (non HMO) | Admitting: Physician Assistant

## 2018-09-26 DIAGNOSIS — Z6830 Body mass index (BMI) 30.0-30.9, adult: Secondary | ICD-10-CM | POA: Diagnosis not present

## 2018-09-26 DIAGNOSIS — E6609 Other obesity due to excess calories: Secondary | ICD-10-CM | POA: Diagnosis not present

## 2018-09-26 NOTE — Progress Notes (Signed)
   Subjective:    Patient ID: Courtney Morgan, female    DOB: May 10, 1991, 27 y.o.   MRN: [id number]  HPI  Courtney Morgan is here for blood pressure and weight check. Diet and exercise is going well. Denies trouble sleeping or palpitations.   Review of Systems     Objective:   Physical Exam        Assessment & Plan:  Abnormal weight gain - Patient has lost weight. Down 4lbs. A refill on phentermine sent to pharmacy. Patient advised to schedule a follow up in 2 months.   I sent 90 tablets on 8/30 should have 2 more months. Follow up then. Tandy Gaw PA-C

## 2018-09-26 NOTE — Progress Notes (Signed)
Patient advised.

## 2018-10-24 ENCOUNTER — Ambulatory Visit (INDEPENDENT_AMBULATORY_CARE_PROVIDER_SITE_OTHER): Payer: Managed Care, Other (non HMO) | Admitting: Physician Assistant

## 2018-10-24 ENCOUNTER — Encounter: Payer: Self-pay | Admitting: Physician Assistant

## 2018-10-24 VITALS — BP 113/71 | HR 83 | Ht 66.5 in | Wt 182.0 lb

## 2018-10-24 DIAGNOSIS — Z0000 Encounter for general adult medical examination without abnormal findings: Secondary | ICD-10-CM

## 2018-10-24 DIAGNOSIS — Z131 Encounter for screening for diabetes mellitus: Secondary | ICD-10-CM | POA: Diagnosis not present

## 2018-10-24 DIAGNOSIS — Z13220 Encounter for screening for lipoid disorders: Secondary | ICD-10-CM

## 2018-10-24 DIAGNOSIS — E6609 Other obesity due to excess calories: Secondary | ICD-10-CM

## 2018-10-24 DIAGNOSIS — Z6830 Body mass index (BMI) 30.0-30.9, adult: Secondary | ICD-10-CM

## 2018-10-24 LAB — COMPLETE METABOLIC PANEL WITH GFR
AG Ratio: 2 (calc) (ref 1.0–2.5)
ALBUMIN MSPROF: 4.3 g/dL (ref 3.6–5.1)
ALKALINE PHOSPHATASE (APISO): 56 U/L (ref 33–115)
ALT: 12 U/L (ref 6–29)
AST: 14 U/L (ref 10–30)
BUN: 14 mg/dL (ref 7–25)
CO2: 25 mmol/L (ref 20–32)
CREATININE: 0.82 mg/dL (ref 0.50–1.10)
Calcium: 9.1 mg/dL (ref 8.6–10.2)
Chloride: 108 mmol/L (ref 98–110)
GFR, EST AFRICAN AMERICAN: 114 mL/min/{1.73_m2} (ref >=60)
GFR, EST NON AFRICAN AMERICAN: 99 mL/min/{1.73_m2} (ref >=60)
GLOBULIN: 2.2 g/dL (ref 1.9–3.7)
Glucose, Bld: 89 mg/dL (ref 65–99)
Potassium: 4 mmol/L (ref 3.5–5.3)
SODIUM: 140 mmol/L (ref 135–146)
TOTAL PROTEIN: 6.5 g/dL (ref 6.1–8.1)
Total Bilirubin: 0.6 mg/dL (ref 0.2–1.2)

## 2018-10-24 LAB — LIPID PANEL
CHOL/HDL RATIO: 3.5 (calc) (ref <5.0)
Cholesterol: 176 mg/dL (ref <200)
HDL: 51 mg/dL (ref >50)
LDL Cholesterol (Calc): 107 mg/dL (calc) — ABNORMAL HIGH
Non-HDL Cholesterol (Calc): 125 mg/dL (calc) (ref <130)
Triglycerides: 88 mg/dL (ref <150)

## 2018-10-24 MED ORDER — TOPIRAMATE 50 MG PO TABS: ORAL_TABLET | ORAL | 0 refills | Status: DC

## 2018-10-24 MED ORDER — PHENTERMINE HCL 8 MG PO TABS: 1.0000 | ORAL_TABLET | Freq: Every day | ORAL | 0 refills | Status: DC

## 2018-10-24 NOTE — Patient Instructions (Signed)

## 2018-10-24 NOTE — Progress Notes (Signed)
Subjective:     Courtney Morgan is a 27 y.o. female and is here for a comprehensive physical exam. The patient reports no problems.  Pt continues to well with weight loss. She weighed at home this am and was 177. She is doing great with phentermine and topamax. She is sleeping great with good energy. No increased anxiety. She feels great. She is exercising a few times a week. Her cravings are well managed.   Social History   Socioeconomic History  . Marital status: Single    Spouse name: Not on file  . Number of children: Not on file  . Years of education: Not on file  . Highest education level: Not on file  Occupational History  . Not on file  Social Needs  . Financial resource strain: Not on file  . Food insecurity:    Worry: Not on file    Inability: Not on file  . Transportation needs:    Medical: Not on file    Non-medical: Not on file  Tobacco Use  . Smoking status: Never Smoker  . Smokeless tobacco: Never Used  Substance and Sexual Activity  . Alcohol use: Yes  . Drug use: No  . Sexual activity: Yes    Birth control/protection: Pill  Lifestyle  . Physical activity:    Days per week: Not on file    Minutes per session: Not on file  . Stress: Not on file  Relationships  . Social connections:    Talks on phone: Not on file    Gets together: Not on file    Attends religious service: Not on file    Active member of club or organization: Not on file    Attends meetings of clubs or organizations: Not on file    Relationship status: Not on file  . Intimate partner violence:    Fear of current or ex partner: Not on file    Emotionally abused: Not on file    Physically abused: Not on file    Forced sexual activity: Not on file  Other Topics Concern  . Not on file  Social History Narrative  . Not on file   Health Maintenance  Topic Date Due  . INFLUENZA VACCINE  04/15/2019 (Originally 07/28/2018)  . PAP SMEAR  01/12/2020  . TETANUS/TDAP  07/28/2021  . HIV Screening   Completed    The following portions of the patient's history were reviewed and updated as appropriate: allergies, current medications, past family history, past medical history, past social history, past surgical history and problem list.  Review of Systems A comprehensive review of systems was negative.   Objective:    BP 113/71   Pulse 83   Ht 5' 6.5" (1.689 m)   Wt 182 lb (82.6 kg)   BMI 28.94 kg/m  General appearance: alert, cooperative and appears stated age Head: Normocephalic, without obvious abnormality, atraumatic Eyes: conjunctivae/corneas clear. PERRL, EOM's intact. Fundi benign. Ears: normal TM's and external ear canals both ears Nose: Nares normal. Septum midline. Mucosa normal. No drainage or sinus tenderness. Throat: lips, mucosa, and tongue normal; teeth and gums normal Neck: no adenopathy, no carotid bruit, no JVD, supple, symmetrical, trachea midline and thyroid not enlarged, symmetric, no tenderness/mass/nodules Back: symmetric, no curvature. ROM normal. No CVA tenderness. Lungs: clear to auscultation bilaterally Heart: regular rate and rhythm, S1, S2 normal, no murmur, click, rub or gallop Abdomen: soft, non-tender; bowel sounds normal; no masses,  no organomegaly Extremities: extremities normal, atraumatic, no cyanosis or  edema Pulses: 2+ and symmetric Skin: Skin color, texture, turgor normal. No rashes or lesions Lymph nodes: Cervical, supraclavicular, and axillary nodes normal. Neurologic: Alert and oriented X 3, normal strength and tone. Normal symmetric reflexes. Normal coordination and gait    Assessment:    Healthy female exam.      Plan:    Marland KitchenMarland KitchenSkyla was seen today for annual exam.  Diagnoses and all orders for this visit:  Routine physical examination -     Lipid panel -     COMPLETE METABOLIC PANEL WITH GFR  Screening for lipid disorders -     Lipid panel  Screening for diabetes mellitus -     COMPLETE METABOLIC PANEL WITH GFR  Class 1  obesity due to excess calories without serious comorbidity with body mass index (BMI) of 30.0 to 30.9 in adult -     Phentermine HCl 8 MG TABS; Take 1 tablet by mouth daily. -     topiramate (TOPAMAX) 50 MG tablet; Take one tablet twice a day.   .. Depression screen [facility] 2/9 10/24/2018 05/16/2018 09/28/2017  Decreased Interest 0 0 0  Down, Depressed, Hopeless 0 0 0  PHQ - 2 Score 0 0 0  Altered sleeping 0 - -  Tired, decreased energy 0 - -  Change in appetite 0 - -  Feeling bad or failure about yourself  0 - -  Trouble concentrating 0 - -  Moving slowly or fidgety/restless 0 - -  Suicidal thoughts 0 - -  PHQ-9 Score 0 - -  Difficult doing work/chores Not difficult at all - -   .Marland Kitchen GAD 7 : Generalized Anxiety Score 10/24/2018  Nervous, Anxious, on Edge 0  Control/stop worrying 0  Worry too much - different things 0  Trouble relaxing 0  Restless 0  Easily annoyed or irritable 0  Afraid - awful might happen 0  Total GAD 7 Score 0  Anxiety Difficulty Not difficult at all   .Marland Kitchen Discussed 150 minutes of exercise a week.  Encouraged vitamin D 1000 units and Calcium 1300mg  or 4 servings of dairy a day.  Fasting labs ordered.  Pap up to date. Pt declined STI testing.  Filled out paperwork for work. Will fax once labs resulted.  Pt declined flu shot.   Continue to work on weight loss. Continue on phentermine and topamax. Refilled today. Follow up in 3 months for weight check.   A little congested today. PE normal. Consider tylenol cold sinus severe, hydration and rest.     See After Visit Summary for Counseling Recommendations

## 2018-10-25 NOTE — Progress Notes (Signed)
Call pt: cholesterol looks good. Kidney, liver, glucose looks great.   Please fill out work form and fax/scan/leave for pick up and call patient when done.

## 2018-11-13 ENCOUNTER — Other Ambulatory Visit: Payer: Self-pay | Admitting: Physician Assistant

## 2018-11-13 DIAGNOSIS — E6609 Other obesity due to excess calories: Secondary | ICD-10-CM

## 2018-12-19 IMAGING — DX DG FOOT COMPLETE 3+V*L*
3 series · 3 of 3 positions shown · non-contrast
Comparison: None.

CLINICAL DATA: Left foot pain across the third, fourth, fifth
metatarsals.

EXAM:
LEFT FOOT - COMPLETE 3+ VIEW

[foot ap]
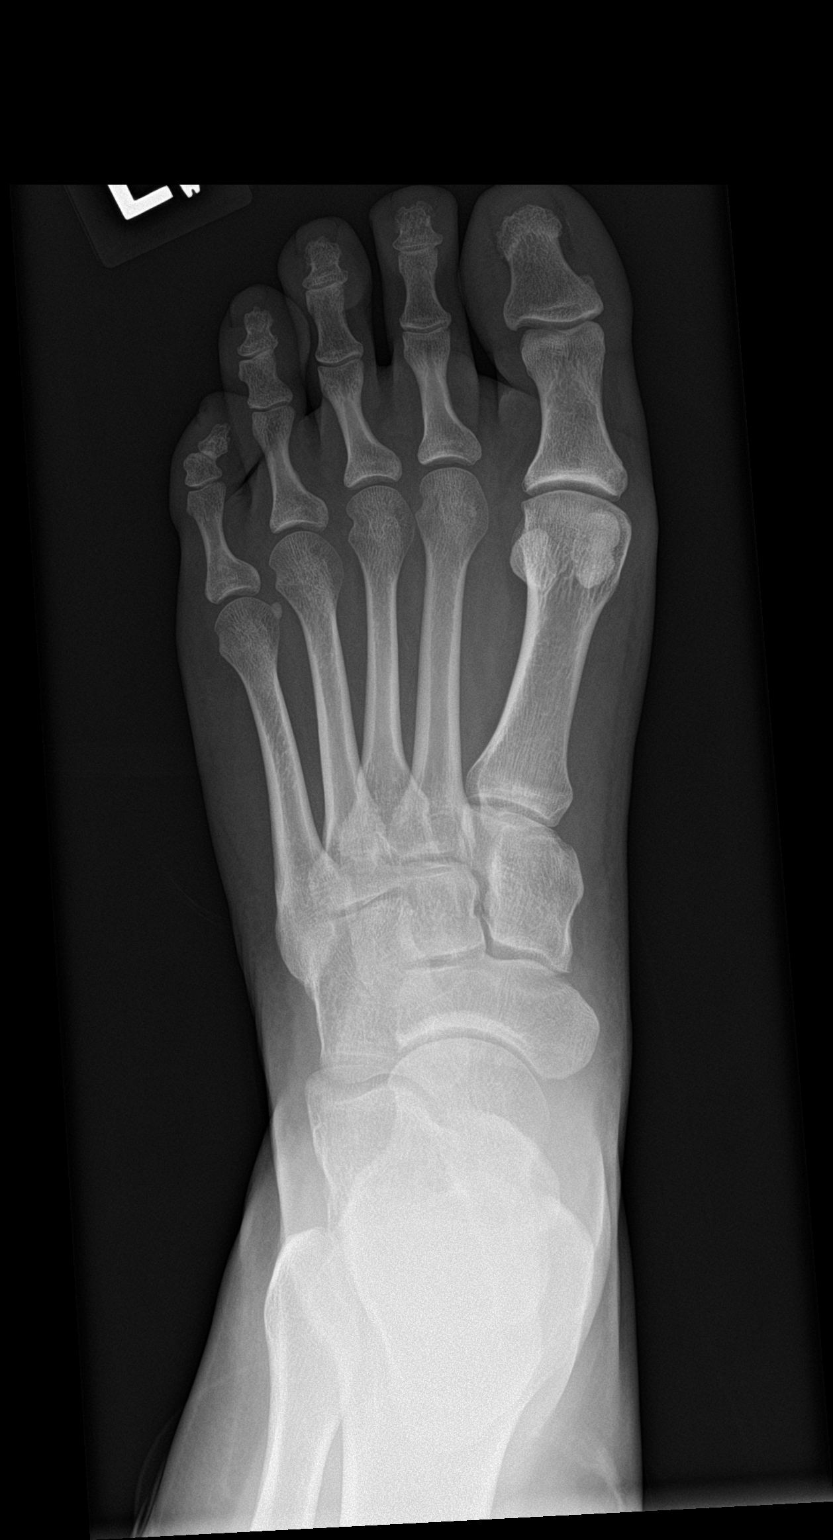

[foot obl]
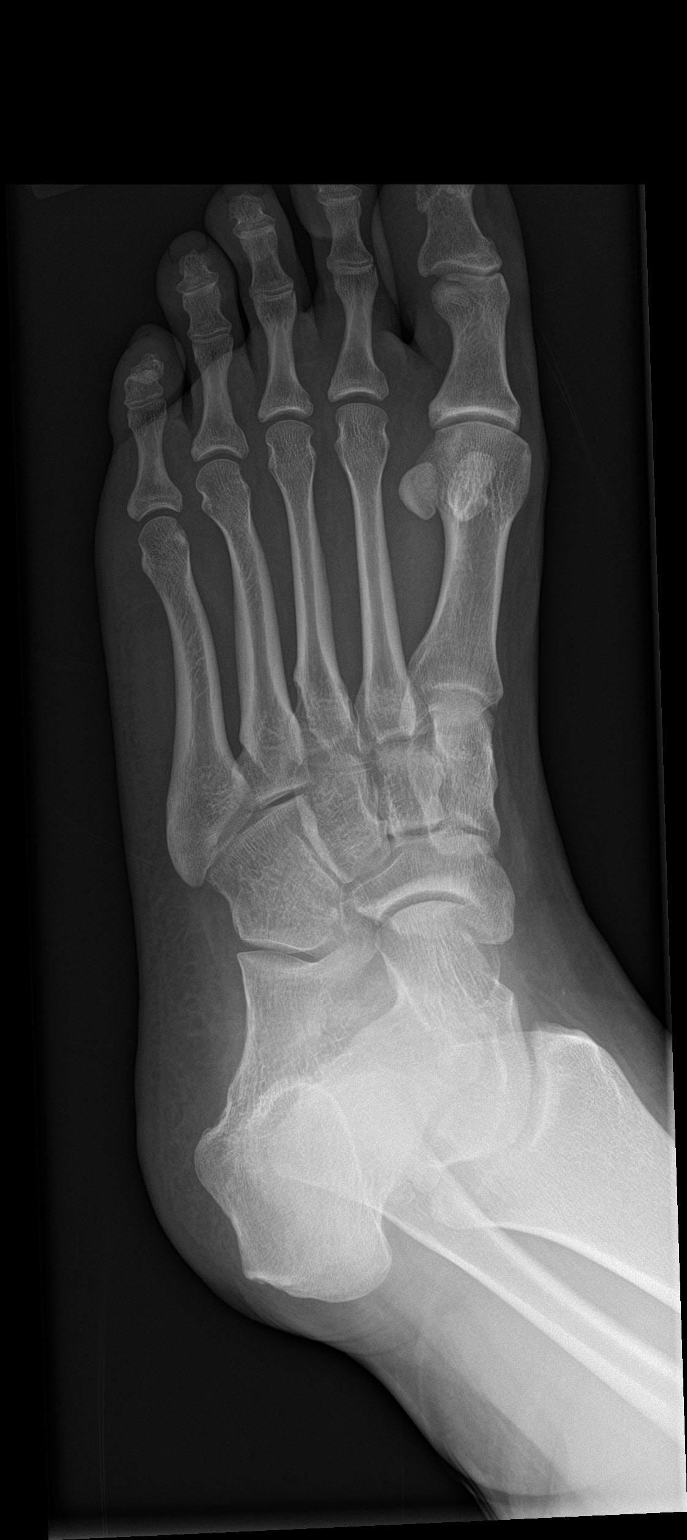

[foot lat]
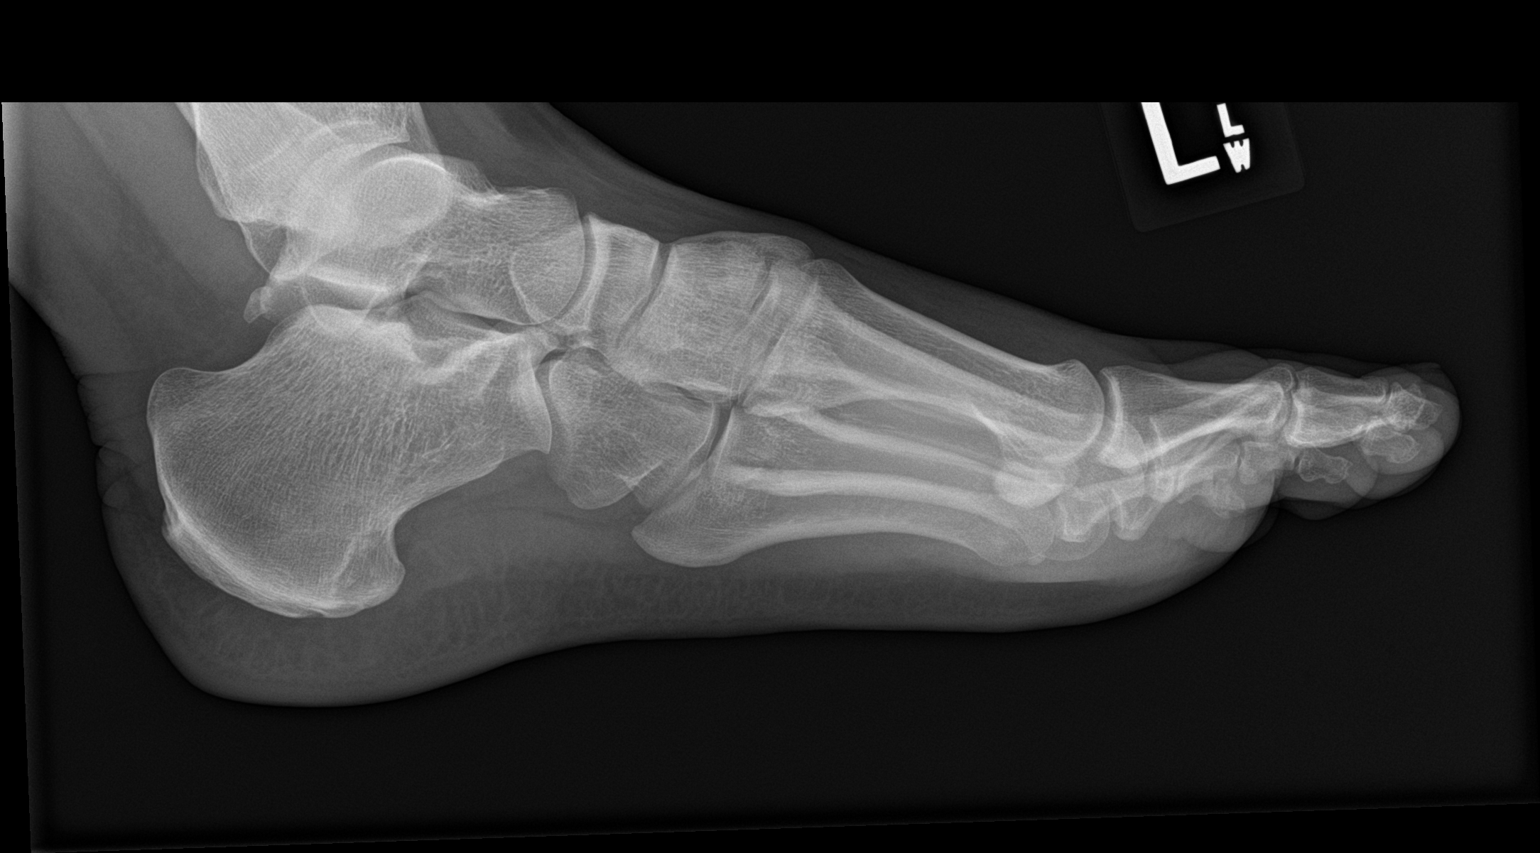

[3 of 3 positions shown; findings below may reference images not displayed]

FINDINGS: There is no evidence of fracture or dislocation. There is no
evidence of arthropathy or other focal bone abnormality. Soft
tissues are unremarkable.
IMPRESSION: No acute osseous injury of the left foot.

## 2019-01-23 ENCOUNTER — Ambulatory Visit (INDEPENDENT_AMBULATORY_CARE_PROVIDER_SITE_OTHER): Payer: Managed Care, Other (non HMO) | Admitting: Physician Assistant

## 2019-01-23 VITALS — BP 125/74 | HR 98 | Wt 180.0 lb

## 2019-01-23 DIAGNOSIS — E6609 Other obesity due to excess calories: Secondary | ICD-10-CM

## 2019-01-23 DIAGNOSIS — R635 Abnormal weight gain: Secondary | ICD-10-CM

## 2019-01-23 DIAGNOSIS — Z6830 Body mass index (BMI) 30.0-30.9, adult: Secondary | ICD-10-CM

## 2019-01-23 MED ORDER — PHENTERMINE HCL 8 MG PO TABS: 1.0000 | ORAL_TABLET | Freq: Every day | ORAL | 0 refills | Status: DC

## 2019-01-23 NOTE — Progress Notes (Signed)
Patient ID: Courtney Morgan, female   DOB: [birth year], 28 y.o.   MRN: [id number]  Will continue with low dose phentermine and topamax. This is a low dose preventative. Will keep on as long as maintaining and hopefully can lose another 10-20lbs to get BMI in the 25 and under range.   Please remind patient these medications are NOT safe for pregnancy. If planning to get pregnant please reach out.   Tandy Gaw PA-C

## 2019-01-23 NOTE — Progress Notes (Signed)
Established Patient Office Visit  Subjective:  Patient ID: Courtney Morgan, female    DOB: 09-30-1991  Age: 28 y.o. MRN: [id number]  CC:  Chief Complaint  Patient presents with  . Weight Check    HPI Courtney Morgan presents for blood pressure and weight check. Diet and exercise is going well. Denies trouble sleeping or palpitations.   Past Medical History:  Diagnosis Date  . Hx of abnormal cervical Pap smear 12/14/2016   2015.    Past Surgical History:  Procedure Laterality Date  . SHOULDER ARTHROSCOPY W/ LABRAL REPAIR Left   . WISDOM TOOTH EXTRACTION      Family History  Problem Relation Age of Onset  . Arthritis/Rheumatoid Mother   . Diabetes Father   . Arthritis/Rheumatoid Maternal Uncle   . Diabetes Paternal Aunt   . Diabetes Paternal Uncle     Social History   Socioeconomic History  . Marital status: Single    Spouse name: Not on file  . Number of children: Not on file  . Years of education: Not on file  . Highest education level: Not on file  Occupational History  . Not on file  Social Needs  . Financial resource strain: Not on file  . Food insecurity:    Worry: Not on file    Inability: Not on file  . Transportation needs:    Medical: Not on file    Non-medical: Not on file  Tobacco Use  . Smoking status: Never Smoker  . Smokeless tobacco: Never Used  Substance and Sexual Activity  . Alcohol use: Yes  . Drug use: No  . Sexual activity: Yes    Birth control/protection: Pill  Lifestyle  . Physical activity:    Days per week: Not on file    Minutes per session: Not on file  . Stress: Not on file  Relationships  . Social connections:    Talks on phone: Not on file    Gets together: Not on file    Attends religious service: Not on file    Active member of club or organization: Not on file    Attends meetings of clubs or organizations: Not on file    Relationship status: Not on file  . Intimate partner violence:    Fear of current or ex  partner: Not on file    Emotionally abused: Not on file    Physically abused: Not on file    Forced sexual activity: Not on file  Other Topics Concern  . Not on file  Social History Narrative  . Not on file    Outpatient Medications Prior to Visit  Medication Sig Dispense Refill  . Levonorgestrel-Ethinyl Estradiol (AMETHIA,CAMRESE) 0.15-0.03 &0.01 MG tablet TAKE 1 TABLET BY MOUTH DAILY. 91 tablet 3  . Phentermine HCl 8 MG TABS Take 1 tablet by mouth daily. 90 each 0  . topiramate (TOPAMAX) 25 MG tablet TAKE 1 TABLET BY MOUTH TWICE A DAY 180 tablet 1  . topiramate (TOPAMAX) 50 MG tablet Take one tablet twice a day. 180 tablet 0   No facility-administered medications prior to visit.     No Known Allergies  ROS Review of Systems    Objective:    Physical Exam  BP 125/74   Pulse 98   Wt 180 lb (81.6 kg)   SpO2 100%   BMI 28.62 kg/m  Wt Readings from Last 3 Encounters:  01/23/19 180 lb (81.6 kg)  10/24/18 182 lb (82.6 kg)  09/26/18 180 lb (81.6  kg)     There are no preventive care reminders to display for this patient.  There are no preventive care reminders to display for this patient.  No results found for: TSH Lab Results  Component Value Date   WBC 7.3 09/28/2017   HGB 14.8 09/28/2017   HCT 43.0 09/28/2017   MCV 91.3 09/28/2017   PLT 273 09/28/2017   Lab Results  Component Value Date   NA 140 10/24/2018   K 4.0 10/24/2018   CO2 25 10/24/2018   GLUCOSE 89 10/24/2018   BUN 14 10/24/2018   CREATININE 0.82 10/24/2018   BILITOT 0.6 10/24/2018   ALKPHOS 55 12/14/2016   AST 14 10/24/2018   ALT 12 10/24/2018   PROT 6.5 10/24/2018   ALBUMIN 4.2 12/14/2016   CALCIUM 9.1 10/24/2018   Lab Results  Component Value Date   CHOL 176 10/24/2018   Lab Results  Component Value Date   HDL 51 10/24/2018   Lab Results  Component Value Date   LDLCALC 107 (H) 10/24/2018   Lab Results  Component Value Date   TRIG 88 10/24/2018   Lab Results  Component  Value Date   CHOLHDL 3.5 10/24/2018   No results found for: HGBA1C    Assessment & Plan:  Abnormal weight gain - Patient has only lost 2 lbs over the last few months. Will wait for PCP recommendations.    Problem List Items Addressed This Visit    Class 1 obesity due to excess calories without serious comorbidity in adult - Primary    Other Visit Diagnoses    Abnormal weight gain          No orders of the defined types were placed in this encounter.   Follow-up: Return in about 3 months (around 04/24/2019) for weight and blood pressure check with Jade. Earna Coder.    Rande Dario, Janalyn HarderAngela Hale, CMA

## 2019-01-24 NOTE — Addendum Note (Signed)
Addended by: Chalmers Cater on: 01/24/2019 09:17 AM   Modules accepted: Orders

## 2019-02-07 ENCOUNTER — Other Ambulatory Visit: Payer: Self-pay

## 2019-02-07 DIAGNOSIS — Z01419 Encounter for gynecological examination (general) (routine) without abnormal findings: Secondary | ICD-10-CM

## 2019-02-07 MED ORDER — LEVONORGEST-ETH ESTRAD 91-DAY 0.15-0.03 &0.01 MG PO TABS: 1.0000 | ORAL_TABLET | Freq: Every day | ORAL | 2 refills | Status: DC

## 2019-04-14 ENCOUNTER — Telehealth: Payer: Self-pay | Admitting: Physician Assistant

## 2019-04-14 MED ORDER — TOPIRAMATE 50 MG PO TABS: 50.0000 mg | ORAL_TABLET | Freq: Two times a day (BID) | ORAL | 2 refills | Status: DC

## 2019-04-14 NOTE — Telephone Encounter (Signed)
Medication refills

## 2019-05-08 ENCOUNTER — Ambulatory Visit: Payer: Managed Care, Other (non HMO) | Admitting: Physician Assistant

## 2019-07-18 ENCOUNTER — Other Ambulatory Visit: Payer: Self-pay | Admitting: Physician Assistant

## 2019-08-15 ENCOUNTER — Other Ambulatory Visit: Payer: Self-pay | Admitting: Physician Assistant

## 2019-09-10 ENCOUNTER — Other Ambulatory Visit: Payer: Self-pay | Admitting: Physician Assistant

## 2019-11-05 ENCOUNTER — Other Ambulatory Visit: Payer: Self-pay | Admitting: Physician Assistant

## 2019-11-05 DIAGNOSIS — Z01419 Encounter for gynecological examination (general) (routine) without abnormal findings: Secondary | ICD-10-CM

## 2019-11-06 ENCOUNTER — Telehealth: Payer: Self-pay | Admitting: Physician Assistant

## 2019-11-06 NOTE — Telephone Encounter (Signed)
Left patient a voicemail with information below. Let patient know to call us back to schedule an appointment in the office or a virtual visit.  °

## 2019-11-06 NOTE — Telephone Encounter (Signed)
-----   Message from Tasia Catchings, [place] sent at 11/06/2019  8:39 AM EST ----- Please call this patient for a follow up with Jade to get further refills. Thanks

## 2020-04-02 ENCOUNTER — Other Ambulatory Visit: Payer: Self-pay

## 2020-04-02 ENCOUNTER — Encounter: Payer: Self-pay | Admitting: Physician Assistant

## 2020-04-02 ENCOUNTER — Ambulatory Visit (INDEPENDENT_AMBULATORY_CARE_PROVIDER_SITE_OTHER): Payer: Managed Care, Other (non HMO) | Admitting: Physician Assistant

## 2020-04-02 VITALS — BP 117/74 | HR 76 | Temp 99.0°F | Wt 227.0 lb

## 2020-04-02 DIAGNOSIS — E6609 Other obesity due to excess calories: Secondary | ICD-10-CM | POA: Diagnosis not present

## 2020-04-02 DIAGNOSIS — Z6834 Body mass index (BMI) 34.0-34.9, adult: Secondary | ICD-10-CM | POA: Diagnosis not present

## 2020-04-02 DIAGNOSIS — R635 Abnormal weight gain: Secondary | ICD-10-CM | POA: Diagnosis not present

## 2020-04-02 MED ORDER — TOPIRAMATE 50 MG PO TABS: 50.0000 mg | ORAL_TABLET | Freq: Two times a day (BID) | ORAL | 0 refills | Status: DC

## 2020-04-02 MED ORDER — PHENTERMINE HCL 8 MG PO TABS: 1.0000 | ORAL_TABLET | Freq: Every day | ORAL | 0 refills | Status: AC

## 2020-04-02 NOTE — Progress Notes (Signed)
Subjective:    Patient ID: Courtney Morgan, female    DOB: 04-Jan-1991, 29 y.o.   MRN: [id number]  HPI  Pt is a 29 yo female who presents to the clinic to discuss weight. She has gained almost 50lbs since October 2020. She has been seen in past for weight and was started on topamax and phentermine low dose. She was down to 178 and stayed there for a year. With the pandedmic she stopped all medications in march 2020. She maintained until October. She moved into a new house and she stopped exercising and eating more. The holidays she "was out of control". She would like to go back on medication. She tolerated it really well in the past. She has tried keto and other diets but too hard to follow.   Pt not on OcP. They are using condoms.   .. Active Ambulatory Problems    Diagnosis Date Noted  . Hx of abnormal cervical Pap smear 12/14/2016  . Atypical squamous cell changes of undetermined significance (ASCUS) on cervical cytology with positive high risk human papilloma virus (HPV) 01/18/2017  . Inguinal lymphadenopathy 09/28/2017  . 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 Encounter for weight loss counseling 04/21/2018  . Class 1 obesity due to excess calories without serious comorbidity with body mass index (BMI) of 34.0 to 34.9 in adult 04/21/2018  . Oral contraceptive use 08/26/2018  . Abnormal weight gain 04/03/2020   Resolved Ambulatory Problems    Diagnosis Date Noted  . No Resolved Ambulatory Problems   No Additional Past Medical History     Review of Systems  All other systems reviewed and are negative.      Objective:   Physical Exam Vitals reviewed.  Constitutional:      Appearance: Normal appearance. She is obese.  Cardiovascular:     Rate and Rhythm: Normal rate and regular rhythm.     Pulses: Normal pulses.  Pulmonary:     Effort: Pulmonary effort is normal.     Breath sounds: Normal breath sounds.  Musculoskeletal:     Cervical back: Normal range of motion and neck supple. No  tenderness.  Lymphadenopathy:     Cervical: No cervical adenopathy.  Neurological:     General: No focal deficit present.     Mental Status: She is alert and oriented to person, place, and time.  Psychiatric:        Mood and Affect: Mood normal.          Assessment & Plan:  Marland KitchenMarland KitchenDiagnoses and all orders for this visit:  Class 1 obesity due to excess calories without serious comorbidity with body mass index (BMI) of 34.0 to 34.9 in adult -     topiramate (TOPAMAX) 50 MG tablet; Take 1 tablet (50 mg total) by mouth 2 (two) times daily. -     Phentermine HCl 8 MG TABS; Take 1 tablet by mouth daily. -     TSH -     COMPLETE METABOLIC PANEL WITH GFR  Abnormal weight gain -     topiramate (TOPAMAX) 50 MG tablet; Take 1 tablet (50 mg total) by mouth 2 (two) times daily. -     Phentermine HCl 8 MG TABS; Take 1 tablet by mouth daily.   Restart topamax and phentermine. Discussed side effects. Encouraged 150 minutes of exercise a week. Discuss IF 16:8. Encouraged to set healthy lifestyles.  Will check TSH and CMP for thyroid issues and diabetes.  Follow up in 3 months sooner if needed.  Pt aware she needs pap. She will schedule per patient.   Spent 30 minutes with patient discussing treatment plan, weight loss strategies.

## 2020-04-03 ENCOUNTER — Encounter: Payer: Self-pay | Admitting: Physician Assistant

## 2020-04-03 DIAGNOSIS — R635 Abnormal weight gain: Secondary | ICD-10-CM | POA: Insufficient documentation

## 2020-05-10 ENCOUNTER — Other Ambulatory Visit: Payer: Self-pay | Admitting: Physician Assistant

## 2020-05-10 DIAGNOSIS — R635 Abnormal weight gain: Secondary | ICD-10-CM

## 2020-05-10 DIAGNOSIS — Z6834 Body mass index (BMI) 34.0-34.9, adult: Secondary | ICD-10-CM

## 2020-07-02 ENCOUNTER — Encounter: Payer: Managed Care, Other (non HMO) | Admitting: Physician Assistant

## 2020-08-07 ENCOUNTER — Other Ambulatory Visit: Payer: Self-pay | Admitting: Physician Assistant

## 2020-08-07 DIAGNOSIS — E6609 Other obesity due to excess calories: Secondary | ICD-10-CM

## 2020-08-07 DIAGNOSIS — R635 Abnormal weight gain: Secondary | ICD-10-CM

## 2020-08-21 ENCOUNTER — Other Ambulatory Visit: Payer: Self-pay | Admitting: Physician Assistant

## 2020-08-21 DIAGNOSIS — R635 Abnormal weight gain: Secondary | ICD-10-CM

## 2020-08-21 DIAGNOSIS — Z6834 Body mass index (BMI) 34.0-34.9, adult: Secondary | ICD-10-CM
